# Patient Record
Sex: Male | Born: 1975 | Race: White | Hispanic: No | Marital: Married | Smoking: Never smoker
Health system: Southern US, Community
[De-identification: ages and names within clinical notes are randomized; demographics above are authoritative.]

## PROBLEM LIST (undated history)

## (undated) DIAGNOSIS — I2510 Atherosclerotic heart disease of native coronary artery without angina pectoris: Secondary | ICD-10-CM

## (undated) DIAGNOSIS — I10 Essential (primary) hypertension: Secondary | ICD-10-CM

## (undated) DIAGNOSIS — I340 Nonrheumatic mitral (valve) insufficiency: Secondary | ICD-10-CM

---

## 2018-07-08 ENCOUNTER — Encounter (HOSPITAL_BASED_OUTPATIENT_CLINIC_OR_DEPARTMENT_OTHER): Payer: Self-pay | Admitting: Emergency Medicine

## 2018-07-08 ENCOUNTER — Emergency Department (HOSPITAL_BASED_OUTPATIENT_CLINIC_OR_DEPARTMENT_OTHER): Payer: BC Managed Care – PPO

## 2018-07-08 ENCOUNTER — Emergency Department (HOSPITAL_BASED_OUTPATIENT_CLINIC_OR_DEPARTMENT_OTHER)
Admission: EM | Admit: 2018-07-08 | Discharge: 2018-07-08 | Disposition: A | Discharge status: Home / Self Care | Payer: BC Managed Care – PPO | Attending: Emergency Medicine | Admitting: Emergency Medicine

## 2018-07-08 ENCOUNTER — Other Ambulatory Visit: Payer: Self-pay

## 2018-07-08 DIAGNOSIS — R011 Cardiac murmur, unspecified: Secondary | ICD-10-CM | POA: Diagnosis not present

## 2018-07-08 DIAGNOSIS — R51 Headache: Secondary | ICD-10-CM | POA: Insufficient documentation

## 2018-07-08 DIAGNOSIS — I2510 Atherosclerotic heart disease of native coronary artery without angina pectoris: Secondary | ICD-10-CM | POA: Diagnosis not present

## 2018-07-08 DIAGNOSIS — I10 Essential (primary) hypertension: Secondary | ICD-10-CM | POA: Diagnosis not present

## 2018-07-08 DIAGNOSIS — R079 Chest pain, unspecified: Secondary | ICD-10-CM | POA: Diagnosis present

## 2018-07-08 DIAGNOSIS — R519 Headache, unspecified: Secondary | ICD-10-CM

## 2018-07-08 HISTORY — DX: Essential (primary) hypertension: I10

## 2018-07-08 HISTORY — DX: Nonrheumatic mitral (valve) insufficiency: I34.0

## 2018-07-08 HISTORY — DX: Atherosclerotic heart disease of native coronary artery without angina pectoris: I25.10

## 2018-07-08 LAB — CBC WITH DIFFERENTIAL/PLATELET
BASOS PCT: 0 %
Basophils Absolute: 0 10*3/uL (ref 0.0–0.1)
Eosinophils Absolute: 0.3 10*3/uL (ref 0.0–0.7)
Eosinophils Relative: 3 %
HEMATOCRIT: 43.8 % (ref 39.0–52.0)
HEMOGLOBIN: 15.6 g/dL (ref 13.0–17.0)
Lymphocytes Relative: 13 %
Lymphs Abs: 1.2 10*3/uL (ref 0.7–4.0)
MCH: 30 pg (ref 26.0–34.0)
MCHC: 35.6 g/dL (ref 30.0–36.0)
MCV: 84.2 fL (ref 78.0–100.0)
Monocytes Absolute: 0.5 10*3/uL (ref 0.1–1.0)
Monocytes Relative: 6 %
NEUTROS ABS: 7 10*3/uL (ref 1.7–7.7)
NEUTROS PCT: 78 %
Platelets: 263 10*3/uL (ref 150–400)
RBC: 5.2 MIL/uL (ref 4.22–5.81)
RDW: 13 % (ref 11.5–15.5)
WBC: 9 10*3/uL (ref 4.0–10.5)

## 2018-07-08 LAB — BASIC METABOLIC PANEL
Anion gap: 10 (ref 5–15)
BUN: 11 mg/dL (ref 6–20)
CALCIUM: 8.7 mg/dL — AB (ref 8.9–10.3)
CO2: 26 mmol/L (ref 22–32)
Chloride: 102 mmol/L (ref 98–111)
Creatinine, Ser: 0.9 mg/dL (ref 0.61–1.24)
GFR calc Af Amer: >60 mL/min (ref >60)
GFR calc non Af Amer: >60 mL/min (ref >60)
Glucose, Bld: 115 mg/dL — ABNORMAL HIGH (ref 70–99)
POTASSIUM: 3.6 mmol/L (ref 3.5–5.1)
Sodium: 138 mmol/L (ref 135–145)

## 2018-07-08 LAB — TROPONIN I: Troponin I: <0.03 ng/mL (ref <0.03)

## 2018-07-08 LAB — D-DIMER, QUANTITATIVE (NOT AT ARMC)

## 2018-07-08 MED ORDER — PROCHLORPERAZINE EDISYLATE 10 MG/2ML IJ SOLN
10.0000 mg | Freq: Once | INTRAMUSCULAR | Status: AC
  Administered 2018-07-08: 10 mg via INTRAVENOUS
  Filled 2018-07-08: qty 2

## 2018-07-08 MED ORDER — SODIUM CHLORIDE 0.9 % IV BOLUS
500.0000 mL | Freq: Once | INTRAVENOUS | Status: AC
  Administered 2018-07-08: 500 mL via INTRAVENOUS

## 2018-07-08 MED ORDER — DIPHENHYDRAMINE HCL 50 MG/ML IJ SOLN
25.0000 mg | Freq: Once | INTRAMUSCULAR | Status: AC
  Administered 2018-07-08: 25 mg via INTRAVENOUS
  Filled 2018-07-08: qty 1

## 2018-07-08 NOTE — ED Provider Notes (Signed)
[facility] EMERGENCY DEPARTMENT Provider Note   CSN: [id number] Arrival date & time: 07/08/18  1759     History   Chief Complaint Chief Complaint  Patient presents with  . Chest Pain    HPI Wesley Nichols is a 42 y.o. male.  The history is provided by the patient and medical records. No language interpreter was used.  Headache   This is a new problem. The current episode started 2 days ago. The problem occurs constantly. The problem has not changed since onset.The headache is associated with bright light and loud noise. The pain is located in the frontal region. The quality of the pain is described as dull. The pain is at a severity of 6/10. The pain is moderate. The pain does not radiate. Pertinent negatives include no fever, no malaise/fatigue, no near-syncope, no palpitations, no shortness of breath, no nausea and no vomiting. He has tried nothing for the symptoms.  Chest Pain   This is a new problem. The current episode started more than 2 days ago. The problem occurs constantly. The problem has not changed since onset.The pain is present in the lateral region. The patient is experiencing no pain. The quality of the pain is described as sharp and brief. The pain does not radiate. Associated symptoms include headaches. Pertinent negatives include no abdominal pain, no back pain, no cough, no diaphoresis, no dizziness, no exertional chest pressure, no fever, no hemoptysis, no irregular heartbeat, no leg pain, no lower extremity edema, no malaise/fatigue, no nausea, no near-syncope, no numbness, no palpitations, no shortness of breath, no vomiting and no weakness. He has tried nothing for the symptoms. The treatment provided no relief.    Past Medical History:  Diagnosis Date  . Coronary artery disease   . Hypertension   . MI (mitral incompetence)     There are no active problems to display for this patient.   History reviewed. No pertinent surgical  history.      Home Medications    Prior to Admission medications   Not on File    Family History History reviewed. No pertinent family history.  Social History Social History   Tobacco Use  . Smoking status: Never Smoker  . Smokeless tobacco: Never Used  Substance Use Topics  . Alcohol use: Never    Frequency: Never  . Drug use: Never     Allergies   Theophyllines   Review of Systems Review of Systems  Constitutional: Negative for chills, diaphoresis, fatigue, fever and malaise/fatigue.  HENT: Positive for rhinorrhea. Negative for congestion.   Eyes: Positive for photophobia. Negative for visual disturbance.  Respiratory: Negative for cough, hemoptysis, chest tightness, shortness of breath, wheezing and stridor.   Cardiovascular: Positive for chest pain. Negative for palpitations, leg swelling and near-syncope.  Gastrointestinal: Negative for abdominal pain, nausea and vomiting.  Genitourinary: Negative for flank pain and frequency.  Musculoskeletal: Negative for back pain.  Neurological: Positive for headaches. Negative for dizziness, weakness, light-headedness and numbness.  Psychiatric/Behavioral: Negative for agitation.  All other systems reviewed and are negative.    Physical Exam Updated Vital Signs BP (!) 152/113 (BP Location: Left Arm)   Pulse 78   Temp 98.6 F (37 C) (Oral)   Resp 18   Ht 5\' 9"  (1.753 m)   Wt 77.1 kg   SpO2 100%   BMI 25.10 kg/m   Physical Exam  Constitutional: He appears well-developed and well-nourished.  Non-toxic appearance. He does not appear ill. No distress.  HENT:  Head: Normocephalic and atraumatic.  Mouth/Throat: Oropharynx is clear and moist. No oropharyngeal exudate.  Eyes: Pupils are equal, round, and reactive to light. Conjunctivae and EOM are normal.  Neck: Normal range of motion. Neck supple.  Cardiovascular: Normal rate, regular rhythm and normal pulses.  Murmur heard.  Systolic murmur is  present. Pulmonary/Chest: Effort normal and breath sounds normal. No stridor. No respiratory distress. He has no decreased breath sounds. He has no wheezes. He has no rhonchi. He has no rales. He exhibits no tenderness.  Abdominal: Soft. He exhibits no distension. There is no tenderness.  Musculoskeletal: Normal range of motion. He exhibits no edema or tenderness.       Right lower leg: He exhibits no tenderness and no edema.       Left lower leg: He exhibits no tenderness and no edema.  Lymphadenopathy:    He has no cervical adenopathy.  Neurological: He is alert. No sensory deficit. He exhibits normal muscle tone.  Skin: Skin is warm and dry. He is not diaphoretic.  Psychiatric: He has a normal mood and affect.  Nursing note and vitals reviewed.    ED Treatments / Results  Labs (all labs ordered are listed, but only abnormal results are displayed) Labs Reviewed  BASIC METABOLIC PANEL - Abnormal; Notable for the following components:      Result Value   Glucose, Bld 115 (*)    Calcium 8.7 (*)    All other components within normal limits  CBC WITH DIFFERENTIAL/PLATELET  TROPONIN I  TROPONIN I  D-DIMER, QUANTITATIVE (NOT AT [facility])    EKG EKG Interpretation  Date/Time:  Saturday July 08 2018 18:03:57 EDT Ventricular Rate:  84 PR Interval:  158 QRS Duration: 100 QT Interval:  374 QTC Calculation: 441 R Axis:   -68 Text Interpretation:  Normal sinus rhythm Pulmonary disease pattern Left anterior fascicular block Abnormal ECG No prior ECG for comparison.  No STEMI Confirmed by Theda Belfast ([id number]) on 07/08/2018 6:21:19 PM   Radiology Dg Chest 2 View  Result Date: 07/08/2018 CLINICAL DATA:  Chest pain. EXAM: CHEST - 2 VIEW COMPARISON:  None. FINDINGS: The heart size and mediastinal contours are within normal limits. Both lungs are clear. No pneumothorax or pleural effusion is noted. The visualized skeletal structures are unremarkable. IMPRESSION: No active cardiopulmonary  disease. Electronically Signed   By: Lupita Raider, M.D.   On: 07/08/2018 19:28    Procedures Procedures (including critical care time)  Medications Ordered in ED Medications  diphenhydrAMINE (BENADRYL) injection 25 mg (25 mg Intravenous Given 07/08/18 1929)  prochlorperazine (COMPAZINE) injection 10 mg (10 mg Intravenous Given 07/08/18 1928)  sodium chloride 0.9 % bolus 500 mL (0 mLs Intravenous Stopped 07/08/18 2041)     Initial Impression / Assessment and Plan / ED Course  I have reviewed the triage vital signs and the nursing notes.  Pertinent labs & imaging results that were available during my care of the patient were reviewed by me and considered in my medical decision making (see chart for details).     Wesley Nichols is a 42 y.o. 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  Patient reports that for the last 3 days he has had chest pain.  He describes it as in the left side of his chest and sharp.  It is not pleuritic or exertional.  He has no associated nausea vomiting, or diaphoresis.  He says this does not feel anything like his prior MI pain.  Here describes as MI being several years ago and related to a blood clot that went to his lungs and his heart.  He did not need any stents.  He says he only takes an occasional baby aspirin when he travels.  He does report that he recently had a plane flight several days ago but denies any leg pain or leg swelling.  He is primarily concerned about headache that he has had over the last 2 or 3 days.  He describes it as all across his head and moderate to severe.  He describes as a 6 out of 10 in severity and associated with photophobia and phonophobia.  He has not had a headache like this in several months.  He reports no neck pain or neck stiffness.  He denies any trauma.  He denies any other urinary symptoms or GI symptoms.  On exam,  patient had a murmur.  Patient's chest was nontender.  Back was nontender.  Lungs were clear.  Neck was nontender with normal range of motion.  No focal neurologic deficit seen.  Patient was having photophobia.  EKG showed no evidence of STEMI.  Given patient's history of DVT and PE with his recent flight, will have d-dimer.  Patient did not want to start with a CT.  Patient will have other laboratory testing to rule out a cardiac cause of his chest pain however patient thinks is likely muscular skeletal.  Patient will also be treated with medications for his headache.  He will get a headache cocktail with Benadryl and Compazine and some fluids.  Patient reported his blood pressure was as high as 160 but is currently in the 130s and 140s.  Patient had symmetric pulses in both upper extremities, his symptoms did not appear to be described as a dissection type pain.    Anticipate reassessment after work-up.  Patient is currently chest pain-free.  Patient reports headache is completely resolved.  His blood pressure has normalized.  His troponin negative x2.  Work-up otherwise reassuring.  D-dimer negative, doubt PE.  Patient does not want admission or further management for his symptoms.  He suspects is related to his headaches.  He reports he will follow-up with his cardiologist and PCP for further management understand strict return precautions.  Patient had no other questions or concerns and was discharged in good condition with resolution of symptoms.   Final Clinical Impressions(s) / ED Diagnoses   Final diagnoses:  Nonintractable headache, unspecified chronicity pattern, unspecified headache type  Nonspecific chest pain    ED Discharge Orders    None      Clinical Impression: 1. Nonintractable headache, unspecified chronicity pattern, unspecified headache type   2. Nonspecific chest pain     Disposition: Discharge  Condition: Good  I have discussed the results, Dx and Tx plan  with the pt(& family if present). He/she/they expressed understanding and agree(s) with the plan. Discharge instructions discussed at great length. Strict return precautions discussed and pt &/or family have verbalized understanding of the instructions. No further questions at time of discharge.    There are no discharge medications for this patient.   Follow Up: [facility] [place] [facility] [address] [id number] mc [place] [address] [phone]    [facility] [address] [place] [address] [phone] Schedule an appointment as soon as possible for a visit  Arya Boxley, Canary Brim, MD 07/09/18 [phone]

## 2018-07-08 NOTE — ED Triage Notes (Signed)
Patient states that he has had chest pain to his left upper chest and into his left lower chest x 2 -3 days  - Also reports that he has had HTN at home

## 2018-07-08 NOTE — ED Notes (Signed)
Patient verbalizes understanding of discharge instructions. Opportunity for questioning and answers were provided. Armband removed by staff, pt discharged from ED to his home via POV.  

## 2018-07-08 NOTE — Discharge Instructions (Signed)
Your work-up today was overall reassuring.  Your EKG did not show a heart attack.  Your lab testing was negative for blood clot and your cardiac enzymes were negative twice.  Your chest x-ray was reassuring.  Given the resolution of your headache, your blood pressure improving, and the resolution of your chest pain, we feel you are safe for discharge home.  Please follow-up with your PCP for reassessment in the next several days.  If any symptoms change or worsen, please return to the nearest emergency department.

## 2019-03-10 IMAGING — DX DG CHEST 2V
2 series · 2 of 2 positions shown · non-contrast
Comparison: None.

CLINICAL DATA: Chest pain.

EXAM:
CHEST - 2 VIEW

[chest pa]
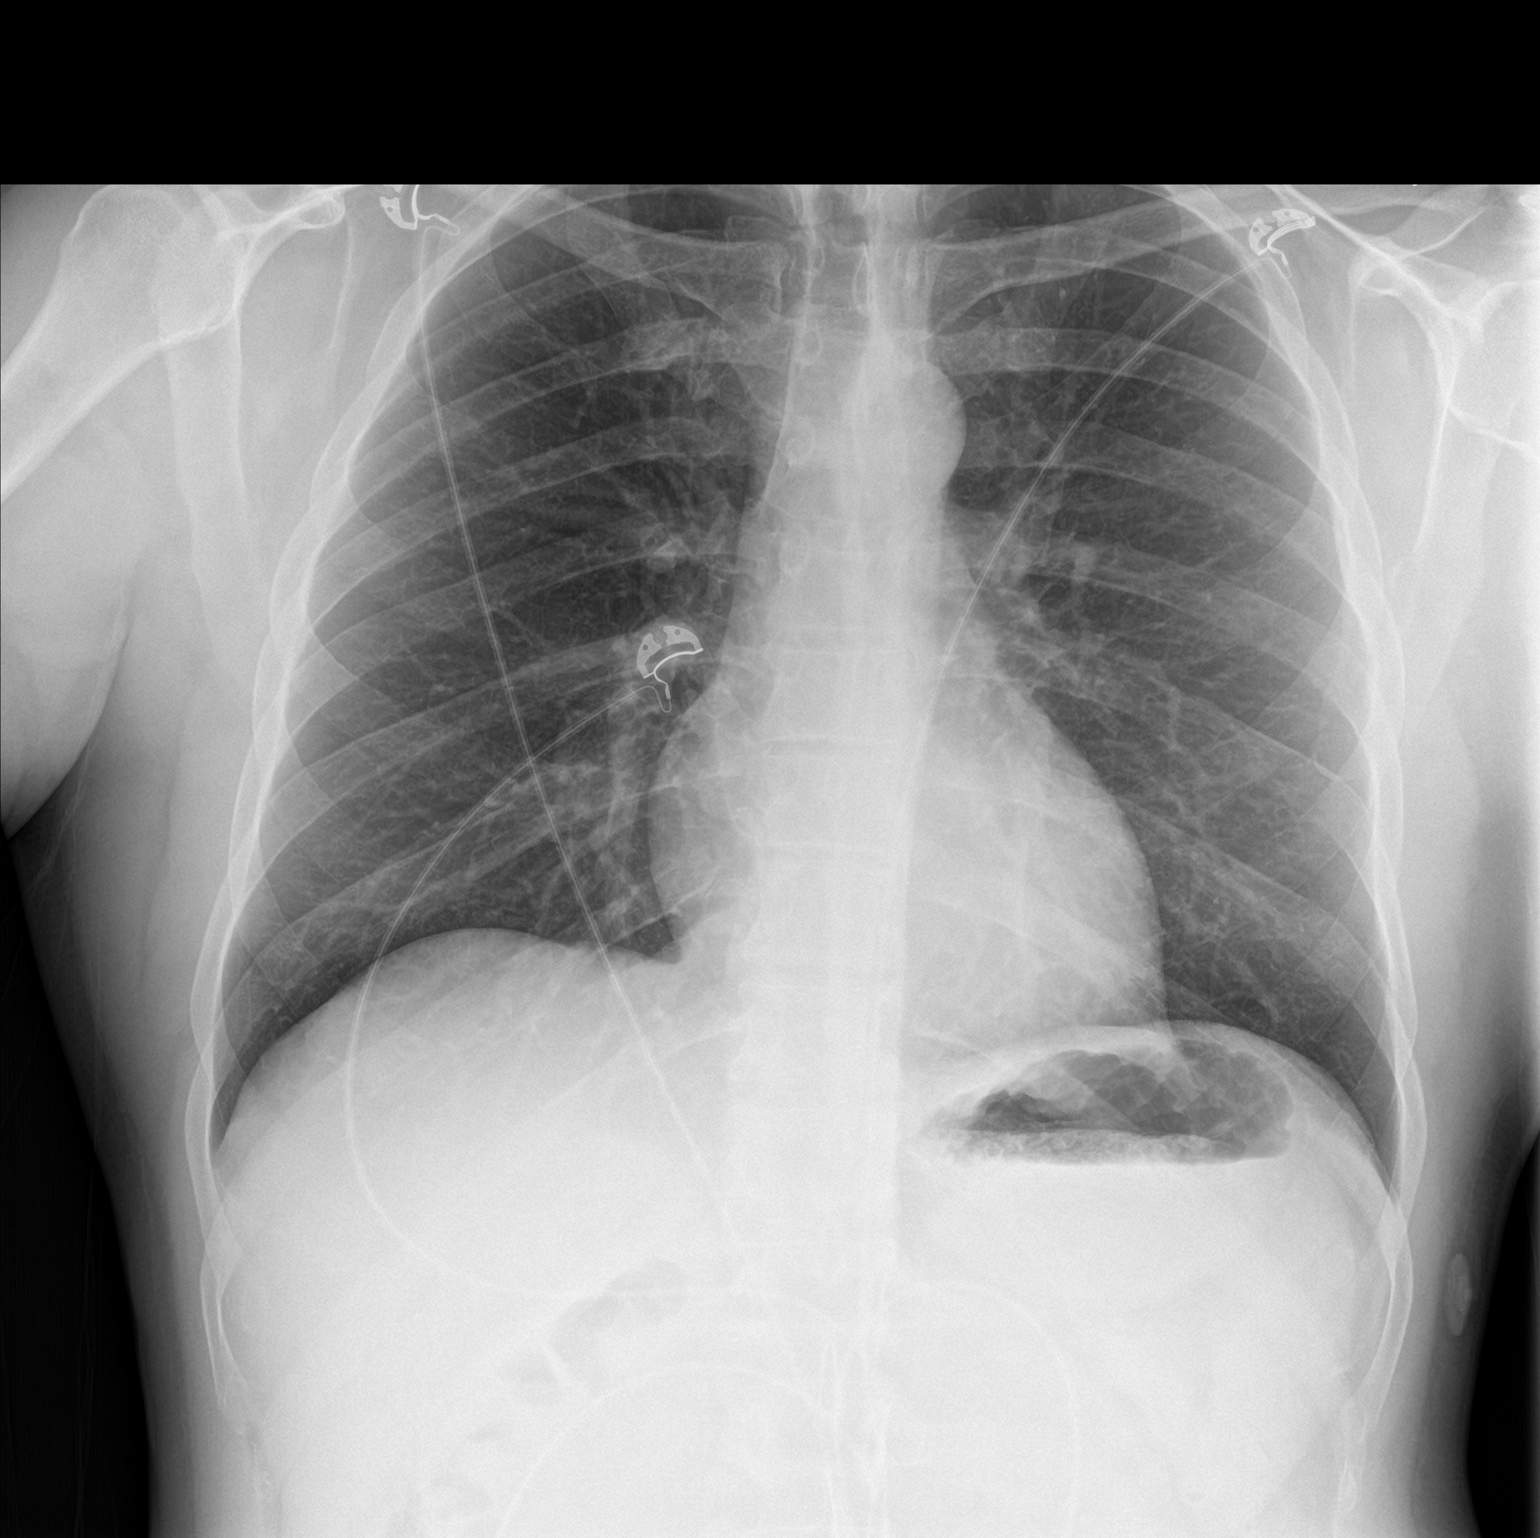

[chest lat]
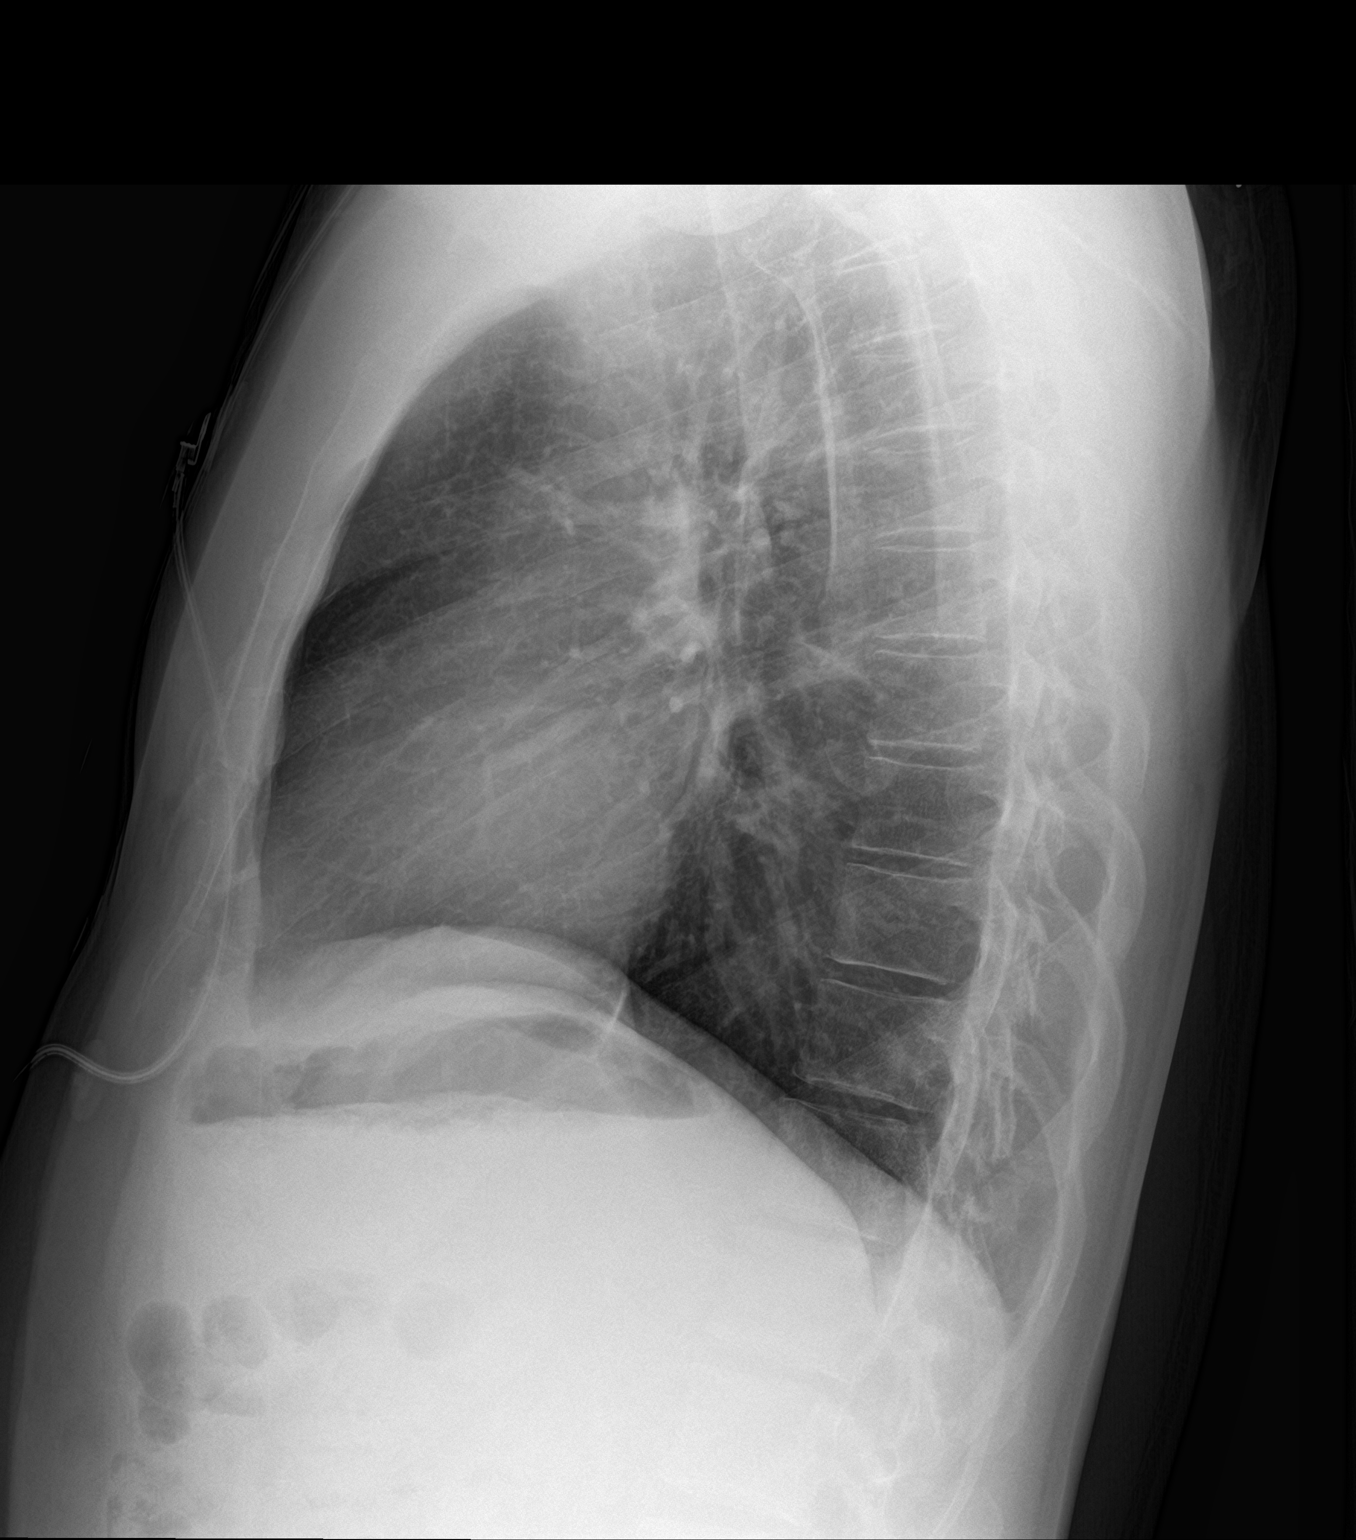

[2 of 2 positions shown; findings below may reference images not displayed]

FINDINGS: The heart size and mediastinal contours are within normal limits.
Both lungs are clear. No pneumothorax or pleural effusion is noted.
The visualized skeletal structures are unremarkable.
IMPRESSION: No active cardiopulmonary disease.

## 2022-03-13 ENCOUNTER — Ambulatory Visit
Admission: EM | Admit: 2022-03-13 | Discharge: 2022-03-13 | Disposition: A | Discharge status: Home / Self Care | Payer: BC Managed Care – PPO

## 2022-03-13 DIAGNOSIS — J45901 Unspecified asthma with (acute) exacerbation: Secondary | ICD-10-CM | POA: Diagnosis not present

## 2022-03-13 MED ORDER — DEXAMETHASONE SODIUM PHOSPHATE 10 MG/ML IJ SOLN
10.0000 mg | Freq: Once | INTRAMUSCULAR | Status: AC
  Administered 2022-03-13: 10 mg via INTRAMUSCULAR

## 2022-03-13 MED ORDER — CETIRIZINE HCL 10 MG PO TABS: 10.0000 mg | ORAL_TABLET | Freq: Every day | ORAL | 2 refills | Status: AC

## 2022-03-13 MED ORDER — ALBUTEROL SULFATE HFA 108 (90 BASE) MCG/ACT IN AERS: 2.0000 | INHALATION_SPRAY | Freq: Four times a day (QID) | RESPIRATORY_TRACT | 0 refills | Status: AC | PRN

## 2022-03-13 MED ORDER — ALBUTEROL SULFATE (2.5 MG/3ML) 0.083% IN NEBU
2.5000 mg | INHALATION_SOLUTION | Freq: Once | RESPIRATORY_TRACT | Status: AC
  Administered 2022-03-13: 2.5 mg via RESPIRATORY_TRACT

## 2022-03-13 NOTE — ED Triage Notes (Signed)
Pt states he believes he inhaled a tiny piece of cotton and states since then he has had a cough and congestion. He states when he coughs he has some chest discomfort to the right side of his chest.

## 2022-03-13 NOTE — Discharge Instructions (Signed)
You received an injection of Decadron 10 mg during your visit today which did significantly reduce the inflammation in your lungs at this time.  Please continue using albuterol 2 puffs every 4-6 hours as needed for cough.  For the next few days, recommend that you just routinely use it 4 times daily just to get ahead of your symptoms.  I also recommend that you begin taking Zyrtec for a few weeks just until the allergy season has completely down.  I provided you with a prescription and, just in case your does not cover Zyrtec, you can certainly download a good Rx coupon from their app.  If you begin to exhibit systemic signs of infection including fever, fatigue, shortness of breath with exertion, or you notice that your cough becomes productive of dark-colored sputum, please return for repeat evaluation for pneumonia.  Thank you for visiting urgent care today.

## 2022-03-13 NOTE — ED Provider Notes (Signed)
[facility]    CSN: [id number] Arrival date & time: 03/13/22  1200    HISTORY   Chief Complaint  Patient presents with   Shortness of Breath   Cough   Nasal Congestion   HPI Wesley Nichols is a 46 y.o. male. Patient states that a week ago he was playing golf and accidentally inhaled a fluffy looking seed that was flying through the air.  Patient states that the next day he began to have a cough and nasal congestion.  Patient states he has discomfort on the right side of his chest when he coughs, denies shortness of breath or cough being productive of sputum.  Patient states he was taking Mucinex up until a few days ago which she felt was not helpful.  Patient reports a remote history of allergies, states he had allergy desensitization therapy up until he was age 25 and since then has not required any allergy medications.  Patient states he currently has a runny nose but feels that everyone has this and does not feel that this is diagnostic of allergies and does not require any allergy medications.  Patient denies known sick contacts.  Patient denies fever, aches, chills, nausea, vomiting, diarrhea, fatigue, headache, sore throat.   Past Medical History:  Diagnosis Date   Coronary artery disease    Hypertension    MI (mitral incompetence)    There are no problems to display for this patient.  History reviewed. No pertinent surgical history.  Home Medications    Prior to Admission medications   Medication Sig Start Date End Date Taking? Authorizing Provider  losartan (COZAAR) 100 MG tablet losartan 100 mg tablet  TAKE 1 TABLET BY MOUTH EVERY DAY 02/01/22  Yes [provider]  PARoxetine (PAXIL) 10 MG tablet Take 1 tablet by mouth daily. 03/04/22  Yes [provider]   Family History History reviewed. No pertinent family history. Social History Social History   Tobacco Use   Smoking status: Never   Smokeless tobacco: Never  Substance Use Topics    Alcohol use: Never   Drug use: Never   Allergies   Theophyllines  Review of Systems Review of Systems Pertinent findings noted in history of present illness.   Physical Exam Triage Vital Signs ED Triage Vitals  Enc Vitals Group     BP 08/07/21 0827 (!) 147/82     Pulse Rate 08/07/21 0827 72     Resp 08/07/21 0827 18     Temp 08/07/21 0827 98.3 F (36.8 C)     Temp Source 08/07/21 0827 Oral     SpO2 08/07/21 0827 98 %     Weight --      Height --      Head Circumference --      Peak Flow --      Pain Score 08/07/21 0826 5     Pain Loc --      Pain Edu? --      Excl. in GC? --   No data found.  Updated Vital Signs BP (!) 143/92 (BP Location: Right Arm)   Pulse 75   Temp 98.3 F (36.8 C) (Oral)   Resp 18   SpO2 93%   Physical Exam Vitals and nursing note reviewed.  Constitutional:      General: He is not in acute distress.    Appearance: Normal appearance. He is not ill-appearing.  HENT:     Head: Normocephalic and atraumatic.     Salivary Glands: Right  salivary gland is not diffusely enlarged or tender. Left salivary gland is not diffusely enlarged or tender.     Right Ear: Tympanic membrane, ear canal and external ear normal. No drainage. No middle ear effusion. There is no impacted cerumen. Tympanic membrane is not erythematous or bulging.     Left Ear: Tympanic membrane, ear canal and external ear normal. No drainage.  No middle ear effusion. There is no impacted cerumen. Tympanic membrane is not erythematous or bulging.     Nose: Nose normal. No nasal deformity, septal deviation, mucosal edema, congestion or rhinorrhea.     Right Turbinates: Not enlarged, swollen or pale.     Left Turbinates: Not enlarged, swollen or pale.     Right Sinus: No maxillary sinus tenderness or frontal sinus tenderness.     Left Sinus: No maxillary sinus tenderness or frontal sinus tenderness.     Mouth/Throat:     Lips: Pink. No lesions.     Mouth: Mucous membranes are moist.  No oral lesions.     Pharynx: Oropharynx is clear. Uvula midline. No posterior oropharyngeal erythema or uvula swelling.     Tonsils: No tonsillar exudate. 0 on the right. 0 on the left.  Eyes:     General: Lids are normal.        Right eye: No discharge.        Left eye: No discharge.     Extraocular Movements: Extraocular movements intact.     Conjunctiva/sclera: Conjunctivae normal.     Right eye: Right conjunctiva is not injected.     Left eye: Left conjunctiva is not injected.  Neck:     Trachea: Trachea and phonation normal.  Cardiovascular:     Rate and Rhythm: Normal rate and regular rhythm.     Pulses: Normal pulses.     Heart sounds: Normal heart sounds. No murmur heard.   No friction rub. No gallop.  Pulmonary:     Effort: Pulmonary effort is normal. No tachypnea, bradypnea, accessory muscle usage, prolonged expiration, respiratory distress or retractions.     Breath sounds: No stridor, decreased air movement or transmitted upper airway sounds. Examination of the right-upper field reveals decreased breath sounds. Examination of the left-upper field reveals decreased breath sounds. Examination of the right-middle field reveals decreased breath sounds. Examination of the left-middle field reveals decreased breath sounds. Examination of the right-lower field reveals decreased breath sounds. Examination of the left-lower field reveals decreased breath sounds. Decreased breath sounds present. No wheezing, rhonchi or rales.     Comments: Repeat auscultation after nebulized albuterol treatment revealed improved work of breathing and improved breath sounds without adventitious breath sounds. Chest:     Chest wall: No tenderness.  Musculoskeletal:        General: Normal range of motion.     Cervical back: Normal range of motion and neck supple. Normal range of motion.  Lymphadenopathy:     Cervical: No cervical adenopathy.  Skin:    General: Skin is warm and dry.     Findings: No  erythema or rash.  Neurological:     General: No focal deficit present.     Mental Status: He is alert and oriented to person, place, and time.  Psychiatric:        Mood and Affect: Mood normal.        Behavior: Behavior normal.    Visual Acuity Right Eye Distance:   Left Eye Distance:   Bilateral Distance:    Right Eye Near:  Left Eye Near:    Bilateral Near:     UC Couse / Diagnostics / Procedures:    EKG  Radiology No results found.  Procedures Procedures (including critical care time)  UC Diagnoses / Final Clinical Impressions(s)   I have reviewed the triage vital signs and the nursing notes.  Pertinent labs & imaging results that were available during my care of the patient were reviewed by me and considered in my medical decision making (see chart for details).   Final diagnoses:  Allergic bronchitis with acute exacerbation   Patient advised that I am not concerned for pneumonia or lower respiratory infection.  I do believe that he is experiencing acute exacerbation of some underlying reactive airway disease.  Patient advised to continue albuterol, patient provided with a Decadron injection and also advised to begin a daily antihistamine for the next few weeks until the spring season is set down.  Return precautions advised.   ED Prescriptions     Medication Sig Dispense Auth. Provider   albuterol (VENTOLIN HFA) 108 (90 Base) MCG/ACT inhaler Inhale 2 puffs into the lungs every 6 (six) hours as needed for wheezing or shortness of breath (Cough). 18 g Theadora Rama Scales, PA-C   cetirizine (ZYRTEC ALLERGY) 10 MG tablet Take 1 tablet (10 mg total) by mouth at bedtime. 30 tablet Theadora Rama Scales, PA-C      PDMP not reviewed this encounter.  Pending results:  Labs Reviewed - No data to display  Medications Ordered in UC: Medications  dexamethasone (DECADRON) injection 10 mg (has no administration in time range)  albuterol (PROVENTIL) (2.5 MG/3ML)  0.083% nebulizer solution 2.5 mg (2.5 mg Nebulization Given 03/13/22 1243)    Disposition Upon Discharge:  Condition: stable for discharge home Home: take medications as prescribed; routine discharge instructions as discussed; follow up as advised.  Patient presented with an acute illness with associated systemic symptoms and significant discomfort requiring urgent management. In my opinion, this is a condition that a prudent lay person (someone who possesses an average knowledge of health and medicine) may potentially expect to result in complications if not addressed urgently such as respiratory distress, impairment of bodily function or dysfunction of bodily organs.   Routine symptom specific, illness specific and/or disease specific instructions were discussed with the patient and/or caregiver at length.   As such, the patient has been evaluated and assessed, work-up was performed and treatment was provided in alignment with urgent care protocols and evidence based medicine.  Patient/parent/caregiver has been advised that the patient may require follow up for further testing and treatment if the symptoms continue in spite of treatment, as clinically indicated and appropriate.  If the patient was tested for COVID-19, Influenza and/or RSV, then the patient/parent/guardian was advised to isolate at home pending the results of his/her diagnostic coronavirus test and potentially longer if they're positive. I have also advised pt that if his/her COVID-19 test returns positive, it's recommended to self-isolate for at least 10 days after symptoms first appeared AND until fever-free for 24 hours without fever reducer AND other symptoms have improved or resolved. Discussed self-isolation recommendations as well as instructions for household member/close contacts as per the [facility] and [place] [employer]HS, and also gave patient the COVID packet with this information.  Patient/parent/caregiver has been advised to return to the  [facility] or PCP in 3-5 days if no better; to PCP or the Emergency Department if new signs and symptoms develop, or if the current signs or symptoms continue to change  or worsen for further workup, evaluation and treatment as clinically indicated and appropriate  The patient will follow up with their current PCP if and as advised. If the patient does not currently have a PCP we will assist them in obtaining one.   The patient may need specialty follow up if the symptoms continue, in spite of conservative treatment and management, for further workup, evaluation, consultation and treatment as clinically indicated and appropriate.  Patient/parent/caregiver verbalized understanding and agreement of plan as discussed.  All questions were addressed during visit.  Please see discharge instructions below for further details of plan.  Discharge Instructions:   Discharge Instructions      You received an injection of Decadron 10 mg during your visit today which did significantly reduce the inflammation in your lungs at this time.  Please continue using albuterol 2 puffs every 4-6 hours as needed for cough.  For the next few days, recommend that you just routinely use it 4 times daily just to get ahead of your symptoms.  I also recommend that you begin taking Zyrtec for a few weeks just until the allergy season has completely down.  I provided you with a prescription and, just in case your does not cover Zyrtec, you can certainly download a good Rx coupon from their app.  If you begin to exhibit systemic signs of infection including fever, fatigue, shortness of breath with exertion, or you notice that your cough becomes productive of dark-colored sputum, please return for repeat evaluation for pneumonia.  Thank you for visiting urgent care today.      This office note has been dictated using Teaching laboratory technicianDragon speech recognition software.  Unfortunately, and despite my best efforts, this method of dictation can  sometimes lead to occasional typographical or grammatical errors.  I apologize in advance if this occurs.     Theadora RamaMorgan, Clayvon Parlett Scales, PA-C 03/13/22 1256

## 2022-07-22 ENCOUNTER — Emergency Department (HOSPITAL_BASED_OUTPATIENT_CLINIC_OR_DEPARTMENT_OTHER)
Admission: EM | Admit: 2022-07-22 | Discharge: 2022-07-22 | Disposition: A | Discharge status: Home / Self Care | Payer: BC Managed Care – PPO | Attending: Emergency Medicine | Admitting: Emergency Medicine

## 2022-07-22 ENCOUNTER — Other Ambulatory Visit: Payer: Self-pay

## 2022-07-22 ENCOUNTER — Emergency Department (HOSPITAL_BASED_OUTPATIENT_CLINIC_OR_DEPARTMENT_OTHER): Payer: BC Managed Care – PPO

## 2022-07-22 ENCOUNTER — Encounter (HOSPITAL_BASED_OUTPATIENT_CLINIC_OR_DEPARTMENT_OTHER): Payer: Self-pay | Admitting: Emergency Medicine

## 2022-07-22 DIAGNOSIS — I160 Hypertensive urgency: Secondary | ICD-10-CM | POA: Insufficient documentation

## 2022-07-22 DIAGNOSIS — J069 Acute upper respiratory infection, unspecified: Secondary | ICD-10-CM | POA: Insufficient documentation

## 2022-07-22 DIAGNOSIS — I10 Essential (primary) hypertension: Secondary | ICD-10-CM | POA: Diagnosis not present

## 2022-07-22 DIAGNOSIS — Z20822 Contact with and (suspected) exposure to covid-19: Secondary | ICD-10-CM | POA: Diagnosis not present

## 2022-07-22 DIAGNOSIS — I2510 Atherosclerotic heart disease of native coronary artery without angina pectoris: Secondary | ICD-10-CM | POA: Diagnosis not present

## 2022-07-22 DIAGNOSIS — Z79899 Other long term (current) drug therapy: Secondary | ICD-10-CM | POA: Insufficient documentation

## 2022-07-22 DIAGNOSIS — R0602 Shortness of breath: Secondary | ICD-10-CM | POA: Diagnosis present

## 2022-07-22 LAB — COMPREHENSIVE METABOLIC PANEL
ALT: 38 U/L (ref 0–44)
AST: 24 U/L (ref 15–41)
Albumin: 4.3 g/dL (ref 3.5–5.0)
Alkaline Phosphatase: 89 U/L (ref 38–126)
Anion gap: 6 (ref 5–15)
BUN: 19 mg/dL (ref 6–20)
CO2: 26 mmol/L (ref 22–32)
Calcium: 9.3 mg/dL (ref 8.9–10.3)
Chloride: 105 mmol/L (ref 98–111)
Creatinine, Ser: 0.98 mg/dL (ref 0.61–1.24)
GFR, Estimated: >60 mL/min (ref >60)
Glucose, Bld: 107 mg/dL — ABNORMAL HIGH (ref 70–99)
Potassium: 4.4 mmol/L (ref 3.5–5.1)
Sodium: 137 mmol/L (ref 135–145)
Total Bilirubin: 0.7 mg/dL (ref 0.3–1.2)
Total Protein: 7.7 g/dL (ref 6.5–8.1)

## 2022-07-22 LAB — CBC WITH DIFFERENTIAL/PLATELET
Abs Immature Granulocytes: 0.02 10*3/uL (ref 0.00–0.07)
Basophils Absolute: 0.1 10*3/uL (ref 0.0–0.1)
Basophils Relative: 1 %
Eosinophils Absolute: 0.1 10*3/uL (ref 0.0–0.5)
Eosinophils Relative: 1 %
HCT: 43.7 % (ref 39.0–52.0)
Hemoglobin: 15.3 g/dL (ref 13.0–17.0)
Immature Granulocytes: 0 %
Lymphocytes Relative: 13 %
Lymphs Abs: 1.1 10*3/uL (ref 0.7–4.0)
MCH: 29 pg (ref 26.0–34.0)
MCHC: 35 g/dL (ref 30.0–36.0)
MCV: 82.9 fL (ref 80.0–100.0)
Monocytes Absolute: 0.4 10*3/uL (ref 0.1–1.0)
Monocytes Relative: 4 %
Neutro Abs: 6.9 10*3/uL (ref 1.7–7.7)
Neutrophils Relative %: 81 %
Platelets: 314 10*3/uL (ref 150–400)
RBC: 5.27 MIL/uL (ref 4.22–5.81)
RDW: 12.3 % (ref 11.5–15.5)
WBC: 8.6 10*3/uL (ref 4.0–10.5)
nRBC: 0 % (ref 0.0–0.2)

## 2022-07-22 LAB — SARS CORONAVIRUS 2 BY RT PCR: SARS Coronavirus 2 by RT PCR: NEGATIVE

## 2022-07-22 LAB — TROPONIN I (HIGH SENSITIVITY): Troponin I (High Sensitivity): 2 ng/L (ref <18)

## 2022-07-22 MED ORDER — KETOROLAC TROMETHAMINE 15 MG/ML IJ SOLN
15.0000 mg | Freq: Once | INTRAMUSCULAR | Status: AC
  Administered 2022-07-22: 15 mg via INTRAVENOUS
  Filled 2022-07-22: qty 1

## 2022-07-22 MED ORDER — DIPHENHYDRAMINE HCL 50 MG/ML IJ SOLN
12.5000 mg | Freq: Once | INTRAMUSCULAR | Status: AC
  Administered 2022-07-22: 12.5 mg via INTRAVENOUS
  Filled 2022-07-22: qty 1

## 2022-07-22 MED ORDER — ALBUTEROL SULFATE HFA 108 (90 BASE) MCG/ACT IN AERS: 2.0000 | INHALATION_SPRAY | RESPIRATORY_TRACT | Status: DC | PRN

## 2022-07-22 MED ORDER — ONDANSETRON HCL 4 MG/2ML IJ SOLN
4.0000 mg | Freq: Once | INTRAMUSCULAR | Status: AC
  Administered 2022-07-22: 4 mg via INTRAVENOUS
  Filled 2022-07-22: qty 2

## 2022-07-22 NOTE — Discharge Instructions (Addendum)
You were seen in the emergency department today for shortness of breath and elevated blood pressure.  Your labs are normal.  We did give you a headache cocktail which improved your symptoms as well as your blood pressure.  It may have been a viral illness that caused her blood pressure to increase over the past few days causing your headache.  Please take your blood pressure daily over the next few days.  I provided you with a blood pressure record sheet.  Please follow-up with your primary care provider in a week to recheck your blood pressure and symptoms.  Please return to the emergency department to get worsening shortness of breath, chest pain, severe headache with elevated blood pressure, signs of stroke.

## 2022-07-22 NOTE — ED Provider Notes (Signed)
[facility] Provider Note   CSN: [id number] Arrival date & time: 07/22/22  1435     History  Chief Complaint  Patient presents with   Shortness of Breath    Wesley Nichols is a 46 y.o. male. With past medical history of hypertension, HLD, CAD, MI 10 years ago per patient, does not see cardiologist who presents to the emergency department with shortness of breath.  Patient states that symptoms began on Friday.  He states that he began having sore throat that lasted from Friday until yesterday.  He has had a mild, nonproductive cough.  States that he was wheezing yesterday evening.  He denies any rhinorrhea, congestion, fevers, nausea, vomiting, diarrhea or sick contacts.  He states that notably, 2 days ago he began having a gradual onset of shortness of breath that feels like "he cannot take a deep breath."  He states that he has additionally had intermittent, sharp right-sided chest pain that is nonradiating.  Denies palpitations, syncope  Today he states that his primary concern is his blood pressure being "so high."  He states that it is normally 120 over 80s and since yesterday has been 150-160/100.  He states that he has a "really bad headache."  That is global.  He has had intermittent blurry vision which is not usual for his hypertensive headaches.  Endorses lightheadedness without dizziness. Denies numbness or tingling to face or arm/leg. No weakness.  He states that he takes losartan 100 mg daily and he has been compliant on this.    Shortness of Breath Associated symptoms: chest pain, cough, headaches, sore throat and wheezing   Associated symptoms: no fever        Home Medications Prior to Admission medications   Medication Sig Start Date End Date Taking? Authorizing Provider  albuterol (VENTOLIN HFA) 108 (90 Base) MCG/ACT inhaler Inhale 2 puffs into the lungs every 6 (six) hours as needed for wheezing or shortness of breath (Cough). 03/13/22    Lynden Oxford Scales, PA-C  cetirizine (ZYRTEC ALLERGY) 10 MG tablet Take 1 tablet (10 mg total) by mouth at bedtime. 03/13/22 06/11/22  Lynden Oxford Scales, PA-C  losartan (COZAAR) 100 MG tablet losartan 100 mg tablet  TAKE 1 TABLET BY MOUTH EVERY DAY 02/01/22   [provider]  PARoxetine (PAXIL) 10 MG tablet Take 1 tablet by mouth daily. 03/04/22   [provider]      Allergies    Theophyllines    Review of Systems   Review of Systems  Constitutional:  Negative for fever.  HENT:  Positive for sore throat.   Respiratory:  Positive for cough, shortness of breath and wheezing.   Cardiovascular:  Positive for chest pain. Negative for palpitations.  Neurological:  Positive for headaches. Negative for dizziness.  All other systems reviewed and are negative.   Physical Exam Updated Vital Signs BP (!) 136/93   Pulse (!) 50   Temp 98 F (36.7 C)   Resp 15   Ht 5\' 9"  (1.753 m)   Wt 81.6 kg   SpO2 99%   BMI 26.58 kg/m  Physical Exam Vitals and nursing note reviewed.  Constitutional:      General: He is not in acute distress.    Appearance: Normal appearance. He is well-developed and normal weight. He is not ill-appearing or toxic-appearing.  HENT:     Head: Normocephalic and atraumatic.     Mouth/Throat:     Mouth: Mucous membranes are moist.  Pharynx: Oropharynx is clear. No oropharyngeal exudate.  Eyes:     General: No scleral icterus.    Pupils: Pupils are equal, round, and reactive to light.  Cardiovascular:     Rate and Rhythm: Normal rate and regular rhythm.     Heart sounds: No murmur heard. Pulmonary:     Effort: Pulmonary effort is normal. No tachypnea or respiratory distress.     Breath sounds: Normal breath sounds. No decreased breath sounds, wheezing, rhonchi or rales.  Abdominal:     General: Bowel sounds are normal.     Palpations: Abdomen is soft.  Musculoskeletal:        General: Normal range of motion.     Cervical back: Normal  range of motion.     Right lower leg: No edema.     Left lower leg: No edema.  Skin:    General: Skin is warm and dry.     Capillary Refill: Capillary refill takes less than 2 seconds.  Neurological:     General: No focal deficit present.     Mental Status: He is alert and oriented to person, place, and time.     GCS: GCS eye subscore is 4. GCS verbal subscore is 5. GCS motor subscore is 6.     Cranial Nerves: Cranial nerves 2-12 are intact. No dysarthria.     Sensory: Sensation is intact.     Motor: Motor function is intact.  Psychiatric:        Mood and Affect: Mood normal.        Behavior: Behavior normal.    ED Results / Procedures / Treatments   Labs (all labs ordered are listed, but only abnormal results are displayed) Labs Reviewed  COMPREHENSIVE METABOLIC PANEL - Abnormal; Notable for the following components:      Result Value   Glucose, Bld 107 (*)    All other components within normal limits  SARS CORONAVIRUS 2 BY RT PCR  CBC WITH DIFFERENTIAL/PLATELET  URINALYSIS, ROUTINE W REFLEX MICROSCOPIC  TROPONIN I (HIGH SENSITIVITY)  TROPONIN I (HIGH SENSITIVITY)   EKG EKG Interpretation  Date/Time:  Thursday July 22 2022 14:42:41 EDT Ventricular Rate:  62 PR Interval:  156 QRS Duration: 114 QT Interval:  408 QTC Calculation: 414 R Axis:   -53 Text Interpretation: Normal sinus rhythm Left anterior fascicular block Abnormal ECG When compared with ECG of 08-Jul-2018 18:03, PREVIOUS ECG IS PRESENT no sig change from previous tracing Confirmed by Arby Barrette [id number]) on 07/22/2022 5:12:18 PM  Radiology DG Chest 2 View  Result Date: 07/22/2022 CLINICAL DATA:  Shortness of breath EXAM: CHEST - 2 VIEW COMPARISON:  03/16/2022 FINDINGS: Cardiac and mediastinal contours are within normal limits. No focal pulmonary opacity. No pleural effusion or pneumothorax. No acute osseous abnormality. IMPRESSION: No acute cardiopulmonary process. Electronically Signed   By: Wiliam Ke M.D.   On: 07/22/2022 15:22    Procedures Procedures   Medications Ordered in ED Medications  albuterol (VENTOLIN HFA) 108 (90 Base) MCG/ACT inhaler 2 puff (has no administration in time range)  ketorolac (TORADOL) 15 MG/ML injection 15 mg (15 mg Intravenous Given 07/22/22 1523)  ondansetron (ZOFRAN) injection 4 mg (4 mg Intravenous Given 07/22/22 1525)  diphenhydrAMINE (BENADRYL) injection 12.5 mg (12.5 mg Intravenous Given 07/22/22 1526)    ED Course/ Medical Decision Making/ A&P Clinical Course as of 07/22/22 1714  Thu Jul 22, 2022  1643 Headache improved with headache cocktail  [LA]    Clinical Course User Index [LA]  Cristopher Peru, PA-C                           Medical Decision Making Amount and/or Complexity of Data Reviewed Labs: ordered. Radiology: ordered.  Risk Prescription drug management.   This patient presents to the ED with chief complaint(s) of shortness of breath with pertinent past medical history of CAD, MI, HTN, HLD which further complicates the presenting complaint. The complaint involves an extensive differential diagnosis and also carries with it a high risk of complications and morbidity.    The differential diagnosis includes ACS, PE, pneumothorax, pleural effusion, cardiac tamponade, aortic dissection, COPD exacerbation, pneumonia, asthma exacerbation, congestive heart failure, viral upper respiratory infection, medication side effect, anaphylaxis, etc.     Additional history obtained: Additional history obtained from  none available Records reviewed Care Everywhere/External Records and Primary Care Documents most recent family medicine physician note  ED Course and Reassessment: 46 year old male who presents to the emergency department with shortness of breath.  After exam, patient primary concern headache with hypertension, along with other symptoms as noted above. Initiated workup with basic labs to include troponin, COVID testing. Also  obtaining EKG, CXR. Will give headache cocktail and reassess symptoms.  Regarding SOB: CXR without pneumonia, ptx, pleural effusion. EKG without ischemia or infarction. Troponin x1 negative. Symptoms over the past 2 days and do not feel he needs delta trop at this time. Doubt ACS. No evidence of FVO, doubt CHF.  Symptoms inconsistent with dissection, myocarditis, pericarditis. Labs are wnl, covid is negative. Considered but doubt PE. PERC negative. Will not d-dimer or CTA PE study.  Likely a viral URI that precipitated elevated BP and hypertensive urgency. He was given HA cocktail which abated symptoms. No headache or blurred vision. He had a non-focal neurological exam and do not feel he needs CT imaging at this time. BP more appropriate at this time at 130s/90s.   Discussed f/u with PCP and checking BP daily over the next week to obtain average readings. May need adjustment but no evidence of end organ damage at this time.  Feel that he is safe for d/c with strict return precautions for worsening symptoms.   Independent labs interpretation:  The following labs were independently interpreted:  CBC nl CMP nl Troponin 2 COVID negative  Independent visualization of imaging: - I independently visualized the following imaging with scope of interpretation limited to determining acute life threatening conditions related to emergency care: CXR, which revealed no acute findings   Consultation: - Consulted or discussed management/test interpretation w/ external professional: Not indicated  Consideration for admission or further workup: Not indicated Social Determinants of health: None identified Final Clinical Impression(s) / ED Diagnoses Final diagnoses:  Hypertensive urgency  Viral upper respiratory tract infection    Rx / DC Orders ED Discharge Orders     None         Cristopher Peru, PA-C 07/22/22 1715    Arby Barrette, MD 07/23/22 1510

## 2022-07-22 NOTE — ED Notes (Signed)
Reviewed discharge instructions and recommendations with pt. Questions answered Pt states understanding. Ambulatory upon discharge

## 2022-07-22 NOTE — ED Triage Notes (Signed)
Congested x 2 days , shortness of breath , Hx BP . Upper right chest sharp pain . URI . Headache , denies sore throat .
